# Patient Record
Sex: Female | Born: 1990 | Race: White | Hispanic: No | Marital: Married | Smoking: Former smoker
Health system: Southern US, Community
[De-identification: ages and names within clinical notes are randomized; demographics above are authoritative.]

## PROBLEM LIST (undated history)

## (undated) DIAGNOSIS — F419 Anxiety disorder, unspecified: Secondary | ICD-10-CM

## (undated) DIAGNOSIS — T7840XA Allergy, unspecified, initial encounter: Secondary | ICD-10-CM

## (undated) DIAGNOSIS — I10 Essential (primary) hypertension: Secondary | ICD-10-CM

## (undated) DIAGNOSIS — Z5189 Encounter for other specified aftercare: Secondary | ICD-10-CM

## (undated) HISTORY — DX: Encounter for other specified aftercare: Z51.89

## (undated) HISTORY — DX: Allergy, unspecified, initial encounter: T78.40XA

## (undated) HISTORY — DX: Essential (primary) hypertension: I10

## (undated) HISTORY — DX: Anxiety disorder, unspecified: F41.9

---
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
## 2019-02-18 ENCOUNTER — Encounter: Payer: Self-pay | Admitting: *Deleted

## 2019-02-18 ENCOUNTER — Emergency Department
Admission: EM | Admit: 2019-02-18 | Discharge: 2019-02-18 | Disposition: A | Discharge status: Home / Self Care | Payer: Self-pay | Attending: Emergency Medicine | Admitting: Emergency Medicine

## 2019-02-18 ENCOUNTER — Emergency Department: Payer: Self-pay

## 2019-02-18 ENCOUNTER — Other Ambulatory Visit: Payer: Self-pay

## 2019-02-18 DIAGNOSIS — G43819 Other migraine, intractable, without status migrainosus: Secondary | ICD-10-CM | POA: Insufficient documentation

## 2019-02-18 MED ORDER — ACETAMINOPHEN 500 MG PO TABS
1000.0000 mg | ORAL_TABLET | Freq: Once | ORAL | Status: AC
  Administered 2019-02-18: 1000 mg via ORAL
  Filled 2019-02-18: qty 2

## 2019-02-18 NOTE — ED Triage Notes (Signed)
First RN Note: Pt states was driving in the car earlier today when her whole body went numb. Pt presents A&O x 4. NAD noted at this time.

## 2019-02-18 NOTE — ED Triage Notes (Addendum)
Pt ambulatory to triage.  Pt reports a headache and vomiting x 1.  Pt reports numbness from legs radiating into upper body and vomited again.   Pt has nausea.  No numbness now.  Pt alert  Speech clear.

## 2019-02-18 NOTE — ED Provider Notes (Signed)
[facility] Emergency Department Provider Note  ____________________________________________   First MD Initiated Contact with Patient 02/18/19 2101     (approximate)  I have reviewed the triage vital signs and the nursing notes.   HISTORY  Chief Complaint Headache    HPI Leslie Zavala is a 28 y.o. female who presents with headaches and vomiting x1.  Patient says she has a history of migraines but this 1 was worse than normal.  It was the first time associated with tingling sensation.  She says the headache started this morning was gradual getting worse and then not at its maximum out when she then developed tingling sensation in her bilateral arms, legs and some weakness in her bilateral arms.  She says last about 15 minutes gradually went away on its own, and brought on by her severe headache.  She denies any blurry vision.  Currently her headache is only a 3 out of 10 and she is feeling much better.  She declined additional pain medication.         PMH: migraines   There are no active problems to display for this patient.     Allergies Patient has no known allergies.  No family history on file.  Social History Social History   Tobacco Use  . Smoking status: Never Smoker  . Smokeless tobacco: Never Used  Substance Use Topics  . Alcohol use: Not Currently  . Drug use: Not Currently      Review of Systems Constitutional: No fever/chills Eyes: No visual changes. ENT: No sore throat. Cardiovascular: Denies chest pain. Respiratory: Denies shortness of breath. Gastrointestinal: No abdominal pain.  No nausea, no vomiting.  No diarrhea.  No constipation. Genitourinary: Negative for dysuria. Musculoskeletal: Negative for back pain. Skin: Negative for rash. Neurological: +headache and tingling. All other ROS negative ____________________________________________   PHYSICAL EXAM:  VITAL SIGNS: ED Triage Vitals  Enc Vitals Group      BP 02/18/19 1717 (!) 149/95     Pulse Rate 02/18/19 1717 63     Resp 02/18/19 1717 18     Temp 02/18/19 1717 98.4 F (36.9 C)     Temp Source 02/18/19 1717 Oral     SpO2 02/18/19 1717 100 %     Weight 02/18/19 1719 183 lb (83 kg)     Height 02/18/19 1719 5\' 5"  (1.651 m)     Head Circumference --      Peak Flow --      Pain Score 02/18/19 1718 6     Pain Loc --      Pain Edu? --      Excl. in [place]? --     Constitutional: Alert and oriented. Well appearing and in no acute distress. Eyes: Conjunctivae are normal. EOMI. Head: Atraumatic. Nose: No congestion/rhinnorhea. Mouth/Throat: Mucous membranes are moist.   Neck: No stridor. Trachea Midline. FROM Cardiovascular: Normal rate, regular rhythm. Grossly normal heart sounds.  Good peripheral circulation. Respiratory: Normal respiratory effort.  No retractions. Lungs CTAB. Gastrointestinal: Soft and nontender. No distention. No abdominal bruits.  Musculoskeletal: No lower extremity tenderness nor edema.  No joint effusions. CN 2-12 are intact. Equal sensation. No drift.  Neurologic:  Normal speech and language. No gross focal neurologic deficits are appreciated.  Skin:  Skin is warm, dry and intact. No rash noted. Psychiatric: Mood and affect are normal. Speech and behavior are normal. GU: Deferred   ____________________________________________   LABS (all labs ordered are listed, but only abnormal results are  displayed)  Labs Reviewed - No data to display ____________________________________________   RADIOLOGY   Official radiology report(s): Ct Head Wo Contrast  Result Date: 02/18/2019 CLINICAL DATA:  Headache, worst headache of life EXAM: CT HEAD WITHOUT CONTRAST TECHNIQUE: Contiguous axial images were obtained from the base of the skull through the vertex without intravenous contrast. COMPARISON:  None FINDINGS: Brain: No evidence of acute infarction, hemorrhage, hydrocephalus, extra-axial collection or mass  lesion/mass effect. Vascular: No hyperdense vessel or unexpected calcification. Skull: No calvarial fracture or suspicious osseous lesion. No scalp swelling or hematoma. Sinuses/Orbits: Paranasal sinuses and mastoid air cells are predominantly clear. Included orbital structures are unremarkable. Other: None IMPRESSION: No evidence of acute intracranial hemorrhage or other acute intracranial abnormality. Electronically Signed   By: Kreg Shropshire M.D.   On: 02/18/2019 17:57    ____________________________________________   PROCEDURES  Procedure(s) performed (including Critical Care):  Procedures   ____________________________________________   INITIAL IMPRESSION / ASSESSMENT AND PLAN / ED COURSE  [name] was evaluated in Emergency Department on 02/18/2019 for the symptoms described in the history of present illness. She was evaluated in the context of the global COVID-19 pandemic, which necessitated consideration that the patient might be at risk for infection with the SARS-CoV-2 virus that causes COVID-19. Institutional protocols and algorithms that pertain to the evaluation of patients at risk for COVID-19 are in a state of rapid change based on information released by regulatory bodies including the CDC and federal and state organizations. These policies and algorithms were followed during the patient's care in the ED.    Pt wilh headache with full body numbness.  Most like complex migraine given now that headache has resolved symptoms are gone.  Did not localizes to part of her body to suggest stroke. Consider MS but no prior flare ups but we discussed f/u with PCP if develops other neuro symptoms. NO infectious symptoms to suggest meningitis.  Pt feels better and denies further rx. Feels comfortable with d/c home.   I discussed the provisional nature of ED diagnosis, the treatment so far, the ongoing plan of care, follow up appointments and return precautions with the patient and  any family or support people present. They expressed understanding and agreed with the plan, discharged home.    ____________________________________________   FINAL CLINICAL IMPRESSION(S) / ED DIAGNOSES      Final diagnoses:  Other migraine without status migrainosus, intractable      MEDICATIONS GIVEN DURING THIS VISIT:  Medications  acetaminophen (TYLENOL) tablet 1,000 mg (1,000 mg Oral Given 02/18/19 2056)     ED Discharge Orders    None       Note:  This document was prepared using Dragon voice recognition software and may include unintentional dictation errors.   Concha Se, MD 02/19/19 1016

## 2019-02-18 NOTE — Discharge Instructions (Signed)
You can take Tylenol 1000 mg every 8 hours and ibuprofen 800 every 8 hours to help with the pain.  Return to the ER for worsening pain or any other concerns.  Otherwise you can follow-up with your primary care doctor.  This is most likely secondary to a complex migraine.

## 2019-02-19 ENCOUNTER — Ambulatory Visit: Payer: Self-pay

## 2019-02-19 ENCOUNTER — Encounter: Payer: Self-pay | Admitting: Emergency Medicine

## 2019-02-19 ENCOUNTER — Emergency Department
Admission: EM | Admit: 2019-02-19 | Discharge: 2019-02-19 | Disposition: A | Discharge status: Home / Self Care | Payer: Self-pay | Attending: Student | Admitting: Student

## 2019-02-19 ENCOUNTER — Emergency Department: Payer: Self-pay

## 2019-02-19 ENCOUNTER — Other Ambulatory Visit: Payer: Self-pay

## 2019-02-19 DIAGNOSIS — R29898 Other symptoms and signs involving the musculoskeletal system: Secondary | ICD-10-CM | POA: Insufficient documentation

## 2019-02-19 LAB — CBC
HCT: 42.7 % (ref 36.0–46.0)
Hemoglobin: 15 g/dL (ref 12.0–15.0)
MCH: 32 pg (ref 26.0–34.0)
MCHC: 35.1 g/dL (ref 30.0–36.0)
MCV: 91 fL (ref 80.0–100.0)
Platelets: 311 10*3/uL (ref 150–400)
RBC: 4.69 MIL/uL (ref 3.87–5.11)
RDW: 12.4 % (ref 11.5–15.5)
WBC: 6.8 10*3/uL (ref 4.0–10.5)
nRBC: 0 % (ref 0.0–0.2)

## 2019-02-19 LAB — COMPREHENSIVE METABOLIC PANEL
ALT: 19 U/L (ref 0–44)
AST: 44 U/L — ABNORMAL HIGH (ref 15–41)
Albumin: 4.7 g/dL (ref 3.5–5.0)
Alkaline Phosphatase: 58 U/L (ref 38–126)
Anion gap: 11 (ref 5–15)
BUN: 9 mg/dL (ref 6–20)
CO2: 22 mmol/L (ref 22–32)
Calcium: 9.2 mg/dL (ref 8.9–10.3)
Chloride: 105 mmol/L (ref 98–111)
Creatinine, Ser: 0.76 mg/dL (ref 0.44–1.00)
GFR calc Af Amer: >60 mL/min (ref >60)
GFR calc non Af Amer: >60 mL/min (ref >60)
Glucose, Bld: 89 mg/dL (ref 70–99)
Potassium: 4.2 mmol/L (ref 3.5–5.1)
Sodium: 138 mmol/L (ref 135–145)
Total Bilirubin: 1.1 mg/dL (ref 0.3–1.2)
Total Protein: 7.8 g/dL (ref 6.5–8.1)

## 2019-02-19 LAB — URINALYSIS, ROUTINE W REFLEX MICROSCOPIC
Bilirubin Urine: NEGATIVE
Glucose, UA: NEGATIVE mg/dL
Hgb urine dipstick: NEGATIVE
Ketones, ur: NEGATIVE mg/dL
Leukocytes,Ua: NEGATIVE
Nitrite: NEGATIVE
Protein, ur: NEGATIVE mg/dL
Specific Gravity, Urine: 1.019 (ref 1.005–1.030)
pH: 5 (ref 5.0–8.0)

## 2019-02-19 LAB — PREGNANCY, URINE: Preg Test, Ur: NEGATIVE

## 2019-02-19 MED ORDER — IOHEXOL 350 MG/ML SOLN
75.0000 mL | Freq: Once | INTRAVENOUS | Status: AC | PRN
  Administered 2019-02-19: 18:00:00 75 mL via INTRAVENOUS

## 2019-02-19 MED ORDER — ACETAMINOPHEN 500 MG PO TABS
1000.0000 mg | ORAL_TABLET | Freq: Once | ORAL | Status: AC
  Administered 2019-02-19: 21:00:00 1000 mg via ORAL
  Filled 2019-02-19: qty 2

## 2019-02-19 NOTE — ED Notes (Signed)
Called no answer in the waiting room 

## 2019-02-19 NOTE — Telephone Encounter (Signed)
Incoming call from Patient stating her left arm  Feels heavier than normal.  Onset was this morning. States that's  comes and goes . Patient reports mild headache.  Pt.  States that she has SOB with exertion. States Heartbeats races.  Reviewed protocol with Patient and recommended Patient go to Ed to be evaluated.  Patient voiced understanding.             Reason for Disposition . [1] Numbness of the face, arm or leg AND [2] gradual onset (days to weeks) AND [3] present now (Exception: foot or hand "asleep")  Answer Assessment - Initial Assessment Questions 1. SYMPTOM: "What is the main symptom you are concerned about?" (e.g., weakness, numbness)     Left side heaviness 2. LOCATION: "What part of the body is involved? (face, arm or leg)  One side or both sides of the body?" Note: Most strokes are on one side of the body.       3. ONSET: "When did this start?" (minutes, hours, days) Note: Most strokes have a sudden/abrupt onset.     This morning 4. PATTERN: "Does this come and go, or has it been constant since it started?" "Is it present now?"     Comes and goes 5. OTHER NEUROLOGIC SYMPTOMS: "Does your child have any of the following symptoms: headache, vision loss, double vision, changes in speech, being unsteady on his feet?"     small  Headache  6. CAUSE: "What do you think is causing the *No Answer* ?"     7. CHILD'S APPEARANCE: "How sick is your child acting?" " What is he doing right now?" If asleep, ask: "How was he acting before he went to sleep?"    Patient state that she become breathess  And heart beats fast.  Protocols used: NEUROLOGIC DEFICIT-P-AH

## 2019-02-19 NOTE — ED Triage Notes (Signed)
Pt states she was seen here yesterday for a migraine, was dc'd home last night, this am, woke up feeling "heavy" on her left side, arm and leg. Walked to triage without difficulty. Face symmetrical. Speech clear. States LKW was midnight. States the weakness comes and goes on her left side. NAD.

## 2019-02-19 NOTE — ED Provider Notes (Signed)
[facility] Emergency Department Provider Note  ____________________________________________   First MD Initiated Contact with Patient 02/19/19 1846     (approximate)  I have reviewed the triage vital signs and the nursing notes.  History  Chief Complaint No chief complaint on file.    HPI Leslie Zavala is a 28 y.o. female with history of migraines who presents for sensation of left sided heaviness. Patient was seen here yesterday for complex migraine which improved with medications. She states when she got home she was feeling improved. This morning she woke up with a very mild headache and felt like her left arm and leg were "heavy" but denies any true weakness, decreased use or coordination. No sensory changes, no speech difficulties, no facial droop. She says this "heavy sensation" seems to come and go. At present, she is completely asymptomatic and is sewing in her room. She denies any personal or family hx of CVA, hypercoagulable disorders, or MS. She does report a family history of aneurysms.    Past Medical Hx History reviewed. No pertinent past medical history.  Problem List There are no active problems to display for this patient.   Past Surgical Hx History reviewed. No pertinent surgical history.  Medications Prior to Admission medications   Not on File    Allergies Patient has no known allergies.  Family Hx No family history on file.  Social Hx Social History   Tobacco Use  . Smoking status: Never Smoker  . Smokeless tobacco: Never Used  Substance Use Topics  . Alcohol use: Not Currently  . Drug use: Not Currently     Review of Systems  Constitutional: Negative for fever, chills. Eyes: Negative for visual changes. ENT: Negative for sore throat. Cardiovascular: Negative for chest pain. Respiratory: Negative for shortness of breath. Gastrointestinal: Negative for nausea, vomiting.  Genitourinary: Negative for  dysuria. Musculoskeletal: Negative for leg swelling. Skin: Negative for rash. Neurological: + for for headaches.   Physical Exam  Vital Signs: ED Triage Vitals  Enc Vitals Group     BP 02/19/19 1435 (!) 141/102     Pulse Rate 02/19/19 1435 73     Resp 02/19/19 1435 16     Temp 02/19/19 1435 99.1 F (37.3 C)     Temp Source 02/19/19 1435 Oral     SpO2 02/19/19 1435 100 %     Weight 02/19/19 1430 183 lb (83 kg)     Height 02/19/19 1430 5\' 5"  (1.651 m)     Head Circumference --      Peak Flow --      Pain Score 02/19/19 1430 0     Pain Loc --      Pain Edu? --      Excl. in [place]? --     Constitutional: Alert and oriented.  Head: Normocephalic. Atraumatic. Eyes: Conjunctivae clear. Sclera anicteric. Nose: No congestion. No rhinorrhea. Mouth/Throat: Wearing mask.  Neck: No stridor.   Cardiovascular: Normal rate, regular rhythm. Extremities well perfused. Respiratory: Normal respiratory effort.  Gastrointestinal: Soft. Non-tender. Non-distended.  Musculoskeletal: No lower extremity edema. No deformities. Neurologic:  Normal speech and language. No gross focal neurologic deficits are appreciated. Alert and oriented.  Face symmetric.  Tongue midline.  Cranial nerves II through XII intact. UE and LE strength 5/5 and symmetric. UE and LE SILT. Normal finger to nose. Normal heel to shin. NIHSS 0.  Skin: Skin is warm, dry and intact. No rash noted. Psychiatric: Mood and affect are appropriate for situation.  EKG  N/A   Radiology  CTA Head: IMPRESSION:  Normal CT head  Normal CTA head. No aneurysm identified.   Procedures  Procedure(s) performed (including critical care):  Procedures   Initial Impression / Assessment and Plan / ED Course  28 y.o. female who presents to the ED for minimal headache and intermittent episodes of left sided heaviness, without true weakness or any other neurological symptoms.   Suspect likely complex migraine. Episodes are intermittent  and she is completely asymptomatic currently, with normal neurological exam. Given this, her age, and lack of risk factors do not suspect CVA or TIA. Could consider MS, though no family history. Did offer MRI today for evaluation of this but patient would like to defer for now and follow up with Neurology as outpatient, which is reasonable. Her labs are w/o actionable derangements and CTA head is negative. Do not feel this represents SAH as her headache today is very mild, if any, per her report, not thunderclap and not worst of life.  As such, given above, will plan for discharge with outpatient follow up with Neurology. Patient agreeable. Given return precautions.    Final Clinical Impression(s) / ED Diagnosis  Final diagnoses:  Sensation of heaviness in extremities       Note:  This document was prepared using Dragon voice recognition software and may include unintentional dictation errors.   Miguel Aschoff., MD 02/20/19 1002

## 2019-02-19 NOTE — Discharge Instructions (Signed)
Thank you for letting us take care of you in the emergency department today.   Please continue to take any regular, prescribed medications.   Please follow up with: - Neurology - Your primary care doctor to review your ER visit and follow up on your symptoms.   Please return to the ER for any new or worsening symptoms.

## 2019-02-19 NOTE — ED Notes (Signed)
Called lab about urine preg. They will work on resulting it now.

## 2019-02-19 NOTE — ED Notes (Signed)
EDP Monks at bedside.  

## 2019-04-02 HISTORY — PX: APPENDECTOMY: SHX54

## 2020-10-27 IMAGING — CT CT HEAD W/O CM
3 series · 16 of 47 positions shown, 19 images · non-contrast
Comparison: None

CLINICAL DATA: Headache, worst headache of life

EXAM:
CT HEAD WITHOUT CONTRAST
TECHNIQUE: Contiguous axial images were obtained from the base of the skull
through the vertex without intravenous contrast.

[Series 2: head wo · axial · 0.47mm/px · z∈[-165,-35]mm · 10 of 32 slices shown, 13 images]
[im 3/32  brain]
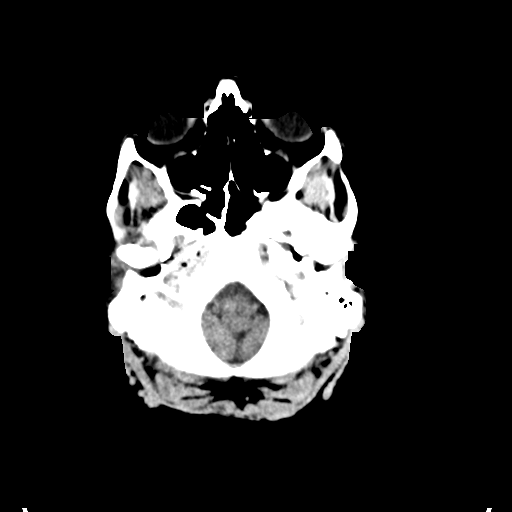
[im 3/32  bone]
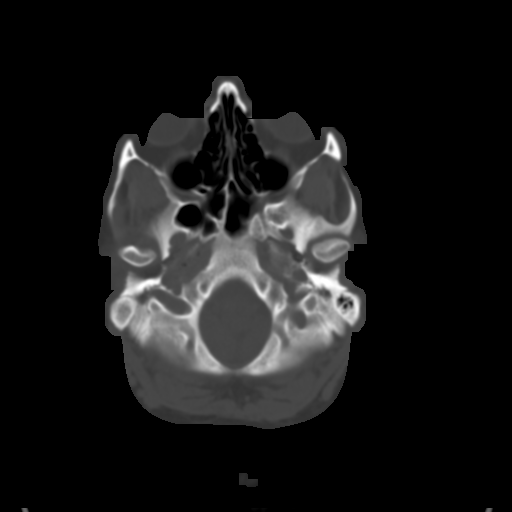
[im 6/32  brain]
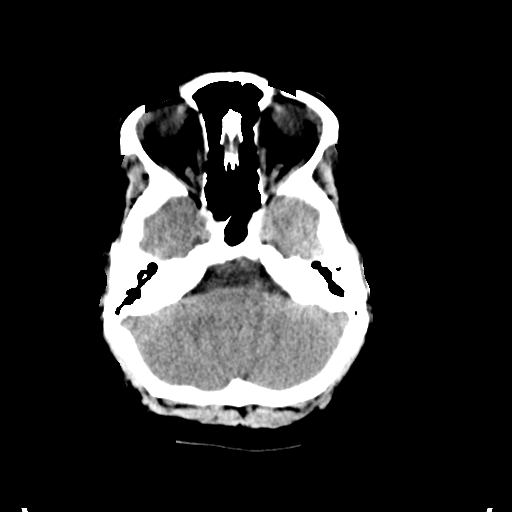
[im 9/32  brain]
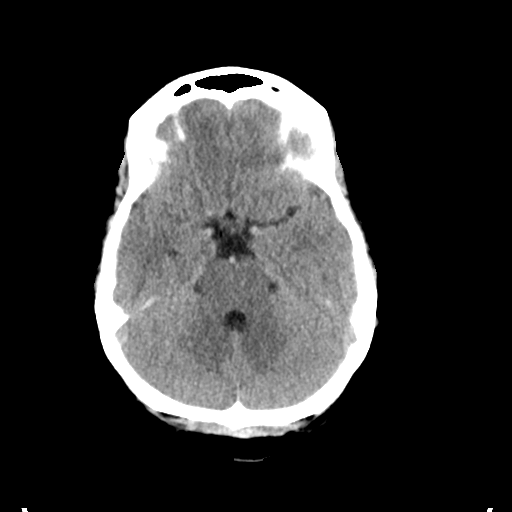
[im 11/32  brain]
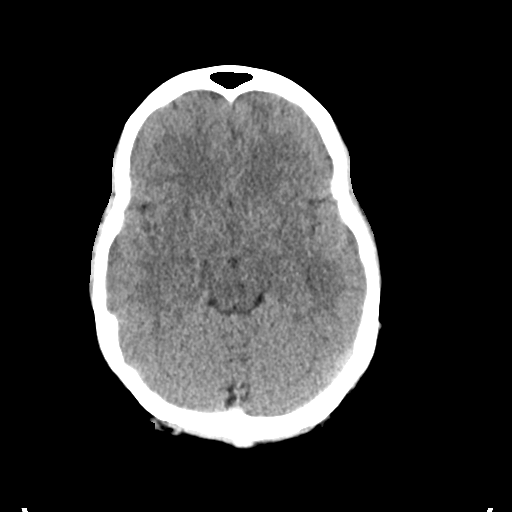
[im 14/32  brain]
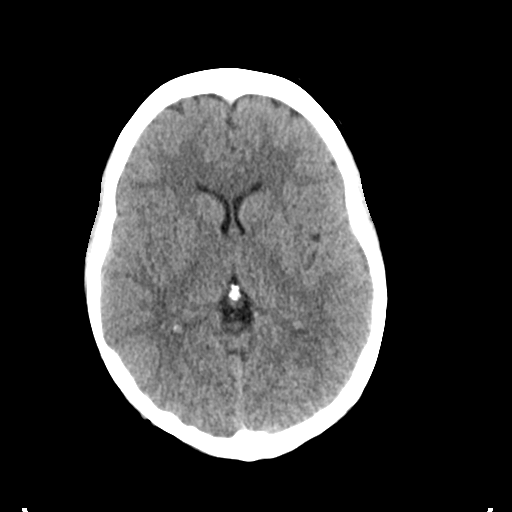
[im 14/32  bone]
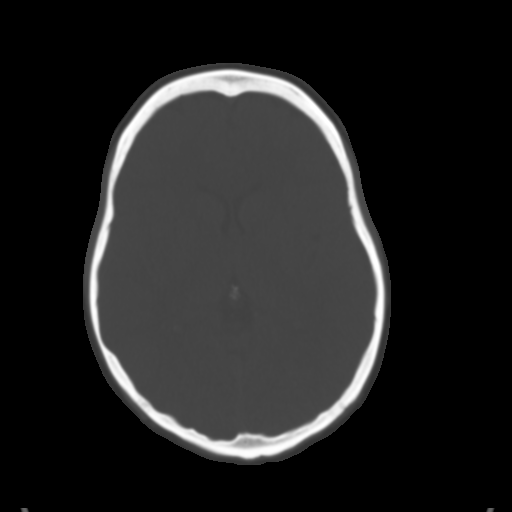
[im 18/32  brain]
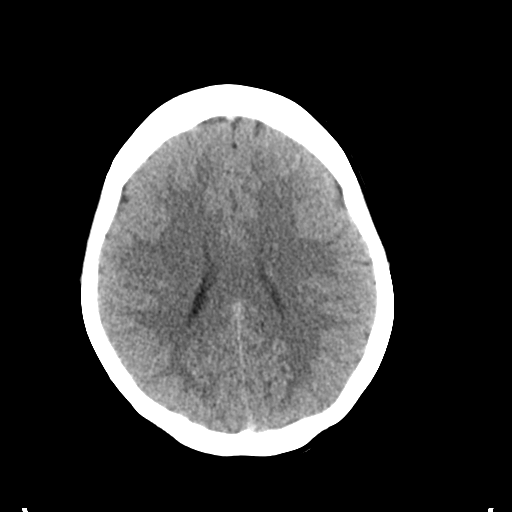
[im 21/32  brain]
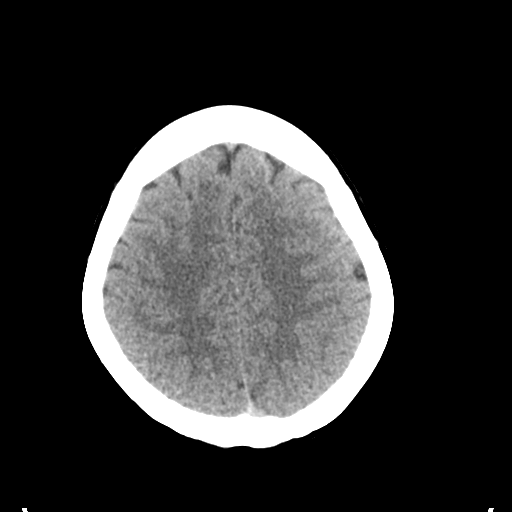
[im 24/32  brain]
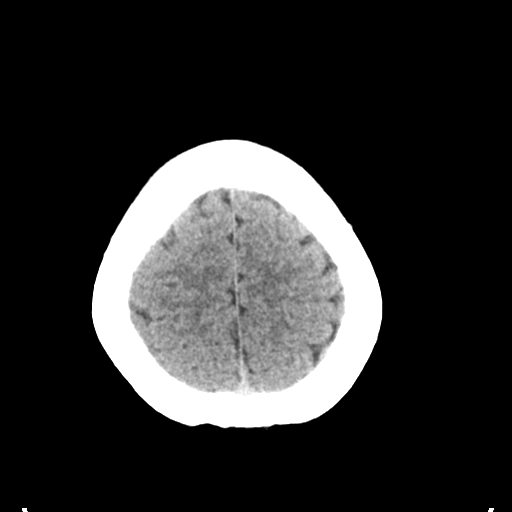
[im 26/32  brain]
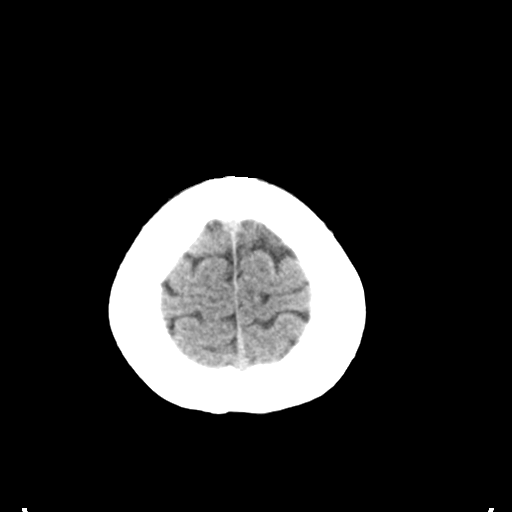
[im 26/32  bone]
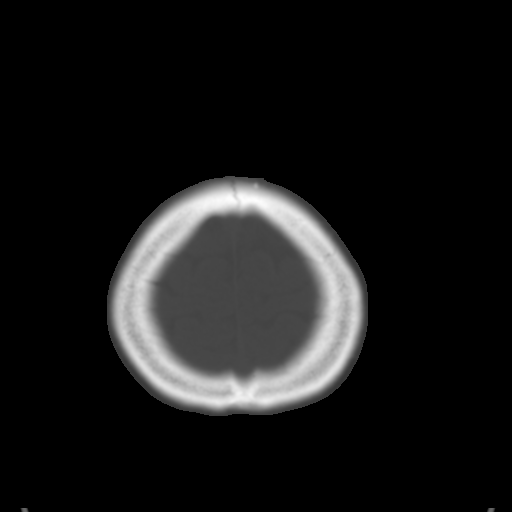
[im 29/32  brain]
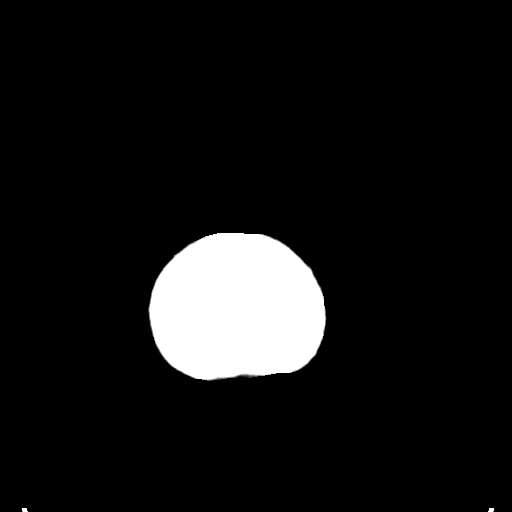

[Series 4: coronal soft tissue · coronal · 0.31mm/px · 3 of 65 slices shown]
[im 22/65  brain]
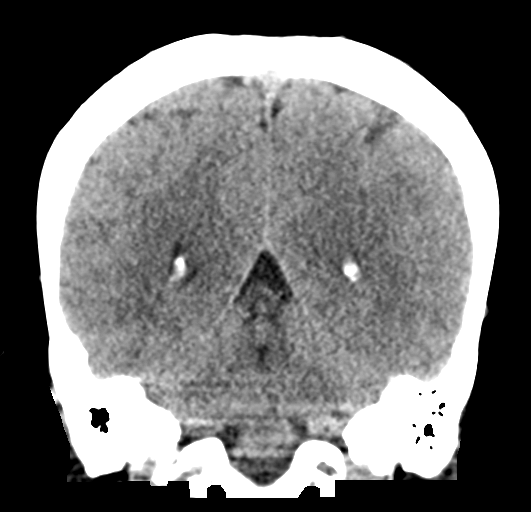
[im 29/65  brain]
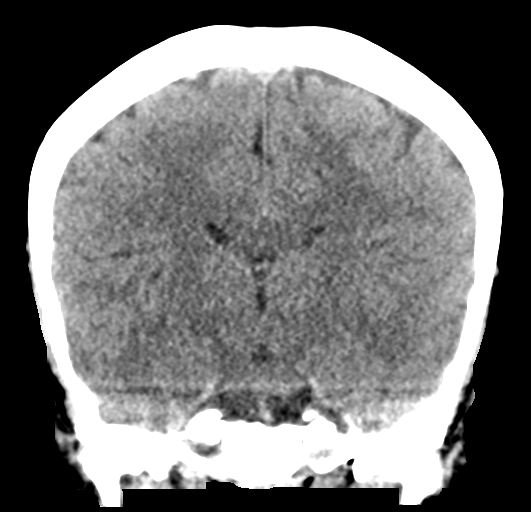
[im 36/65  brain]
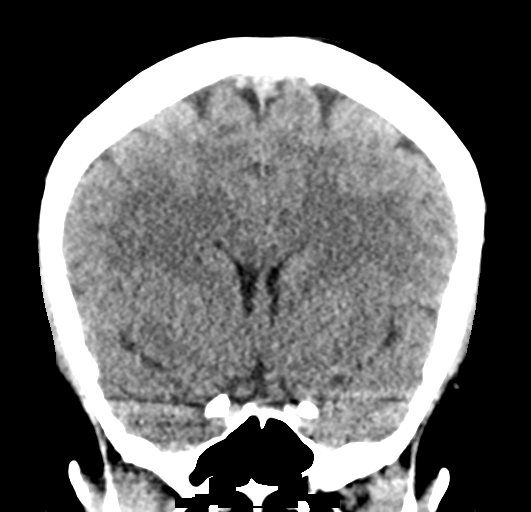

[Series 5: sagittal soft tissue · sagittal · 0.31mm/px · 3 of 55 slices shown]
[im 19/55  brain]
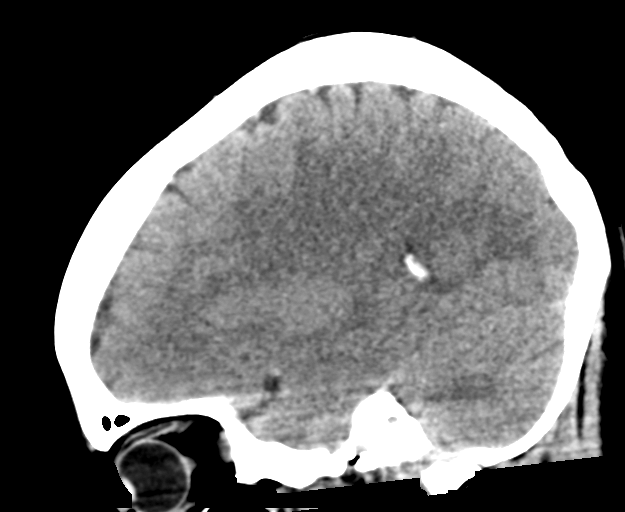
[im 28/55  brain]
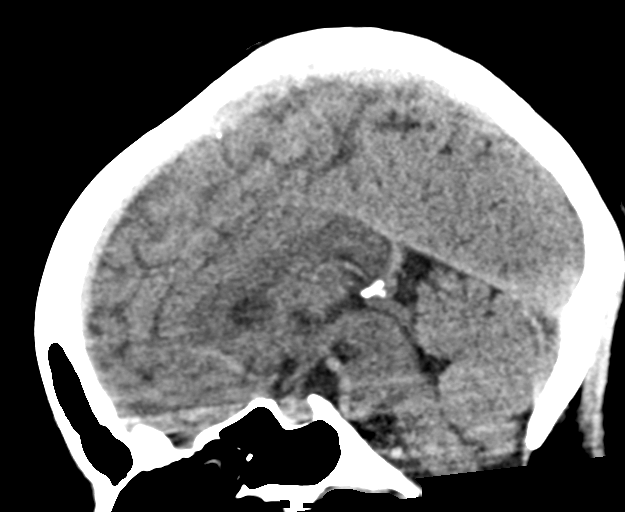
[im 37/55  brain]
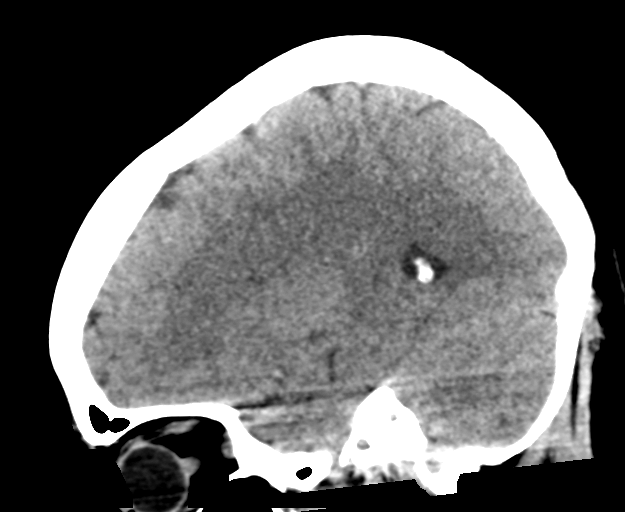

[16 of 47 positions shown; findings below may reference images not displayed]

FINDINGS: Brain: No evidence of acute infarction, hemorrhage, hydrocephalus,
extra-axial collection or mass lesion/mass effect.

Vascular: No hyperdense vessel or unexpected calcification.

Skull: No calvarial fracture or suspicious osseous lesion. No scalp
swelling or hematoma.

Sinuses/Orbits: Paranasal sinuses and mastoid air cells are
predominantly clear. Included orbital structures are unremarkable.

Other: None
IMPRESSION: No evidence of acute intracranial hemorrhage or other acute
intracranial abnormality.

## 2021-08-03 ENCOUNTER — Ambulatory Visit: Payer: Self-pay

## 2021-08-03 NOTE — Telephone Encounter (Signed)
?  Chief Complaint: HTN ?Symptoms: BP 168/116, 145/109, initial 142/117, headache ?Frequency: today during dental procedure ?Pertinent Negatives: Patient denies blurred vision or chest pain ?Disposition: [x] ED /[] Urgent Care (no appt availability in office) / [] Appointment(In office/virtual)/ []  [facility]/ [] Home Care/ [] Refused Recommended Disposition /[] [place] [facility]/ []  Follow-up with PCP ?Additional Notes: pt had root canal done today and BP was elevated initially before procedure was performed. Pt states they let her rest and relax after and BP was still elevated so dentist advised to get checked out. Pt has NPA scheduled but advised her with current symptoms and BP being elevated to go to ED to be evaluated. Pt verbalized understanding. , agent was going to call pt back to provider earlier NPA with different practice. ? ?Reason for Disposition ? Systolic BP  >= 180 OR Diastolic >= 110 ? ?Answer Assessment - Initial Assessment Questions ?1. BLOOD PRESSURE: "What is the blood pressure?" "Did you take at least two measurements 5 minutes apart?" ?    BP 168/116, 145/109 ?2. ONSET: "When did you take your blood pressure?" ?    Today about 20 mins ago  ?3. HOW: "How did you obtain the blood pressure?" (e.g., visiting nurse, automatic home BP monitor) ?    Automatic  ?4. HISTORY: "Do you have a history of high blood pressure?" ?    no ?5. MEDICATIONS: "Are you taking any medications for blood pressure?" "Have you missed any doses recently?" ?    no ?6. OTHER SYMPTOMS: "Do you have any symptoms?" (e.g., headache, chest pain, blurred vision, difficulty breathing, weakness) ?    HA ? ?Protocols used: Blood Pressure - High-A-AH ? ?

## 2021-10-01 ENCOUNTER — Ambulatory Visit: Payer: Self-pay | Admitting: Internal Medicine

## 2022-01-28 ENCOUNTER — Ambulatory Visit: Payer: Self-pay | Admitting: Internal Medicine

## 2022-04-16 ENCOUNTER — Ambulatory Visit: Payer: Self-pay | Admitting: Internal Medicine

## 2022-10-31 ENCOUNTER — Ambulatory Visit: Payer: Self-pay | Admitting: Internal Medicine

## 2022-11-20 ENCOUNTER — Ambulatory Visit (INDEPENDENT_AMBULATORY_CARE_PROVIDER_SITE_OTHER): Payer: Self-pay | Admitting: Internal Medicine

## 2022-11-20 ENCOUNTER — Encounter: Payer: Self-pay | Admitting: Internal Medicine

## 2022-11-20 VITALS — BP 140/96 | HR 78 | Temp 96.6°F | Ht 64.5 in | Wt 212.0 lb

## 2022-11-20 DIAGNOSIS — Z833 Family history of diabetes mellitus: Secondary | ICD-10-CM

## 2022-11-20 DIAGNOSIS — I10 Essential (primary) hypertension: Secondary | ICD-10-CM

## 2022-11-20 DIAGNOSIS — E66812 Obesity, class 2: Secondary | ICD-10-CM | POA: Insufficient documentation

## 2022-11-20 DIAGNOSIS — Z0001 Encounter for general adult medical examination with abnormal findings: Secondary | ICD-10-CM

## 2022-11-20 DIAGNOSIS — Z6835 Body mass index (BMI) 35.0-35.9, adult: Secondary | ICD-10-CM

## 2022-11-20 DIAGNOSIS — R21 Rash and other nonspecific skin eruption: Secondary | ICD-10-CM

## 2022-11-20 MED ORDER — OLMESARTAN MEDOXOMIL 20 MG PO TABS: 20.0000 mg | ORAL_TABLET | Freq: Every day | ORAL | 0 refills | Status: DC

## 2022-11-20 NOTE — Assessment & Plan Note (Signed)
Encourage diet and exercise for weight loss 

## 2022-11-20 NOTE — Progress Notes (Signed)
HPI  Patient presents to clinic today to establish care and for management of the conditions listed below.  HTN: Her BP today is 140/96.  She has had multiple elevated blood pressures in the past.  She is not currently taking antihypertensive medications.  There is no ECG on file.  Flu: never Tetanus: unsure Covid: never  Pap smear: 2020, [facility] Dentist: Biannually  Diet: She does eat meat. She consumes fruits and veggies. She does eat some fried foods. She drinks mostly dt soda, water Exercise: walking  History reviewed. No pertinent past medical history.  No current outpatient medications on file.   No current facility-administered medications for this visit.    Allergies  Allergen Reactions   Codeine Nausea Only and Nausea And Vomiting    Family History  Problem Relation Age of Onset   Diabetes Mother    Depression Mother    Kidney cancer Father    Heart failure Father    Depression Father    Drug abuse Father    Sleep apnea Father    Diabetes Father    Hypertension Sister    Hypertension Sister    Mental illness Brother    Anorexia nervosa Brother    Anxiety disorder Brother    Depression Brother    Alcohol abuse Brother    Breast cancer Paternal Aunt     Social History   Socioeconomic History   Marital status: Single    Spouse name: Not on file   Number of children: Not on file   Years of education: Not on file   Highest education level: Not on file  Occupational History   Not on file  Tobacco Use   Smoking status: Never   Smokeless tobacco: Never  Vaping Use   Vaping status: Never Used  Substance and Sexual Activity   Alcohol use: Not Currently   Drug use: Not Currently   Sexual activity: Not on file  Other Topics Concern   Not on file  Social History Narrative   Not on file   Social Determinants of Health   Financial Resource Strain: Not on file  Food Insecurity: Not on file  Transportation Needs: Not on file  Physical  Activity: Not on file  Stress: Not on file  Social Connections: Not on file  Intimate Partner Violence: Not on file    ROS:  Constitutional: Denies fever, malaise, fatigue, headache or abrupt weight changes.  HEENT: Denies eye pain, eye redness, ear pain, ringing in the ears, wax buildup, runny nose, nasal congestion, bloody nose, or sore throat. Respiratory: Denies difficulty breathing, shortness of breath, cough or sputum production.   Cardiovascular: Denies chest pain, chest tightness, palpitations or swelling in the hands or feet.  Gastrointestinal: Denies abdominal pain, bloating, constipation, diarrhea or blood in the stool.  GU: Denies frequency, urgency, pain with urination, blood in urine, odor or discharge. Musculoskeletal: Denies decrease in range of motion, difficulty with gait, muscle pain or joint pain and swelling.  Skin: Pt reports recurrent rashes. Denies redness, lesions or ulcercations.  Neurological: Denies dizziness, difficulty with memory, difficulty with speech or problems with balance and coordination.  Psych: Denies anxiety, depression, SI/HI.  No other specific complaints in a complete review of systems (except as listed in HPI above).  PE:  BP (!) 140/96 (BP Location: Left Arm, Patient Position: Sitting, Cuff Size: Large)   Pulse 78   Temp (!) 96.6 F (35.9 C) (Temporal)   Ht 5' 4.5" (1.638 m)  Wt 212 lb (96.2 kg)   SpO2 96%   BMI 35.83 kg/m  Wt Readings from Last 3 Encounters:  11/20/22 212 lb (96.2 kg)  02/19/19 180 lb (81.6 kg)  02/18/19 183 lb (83 kg)    General: Appears her stated age, obese, in NAD. HEENT: Head: normal shape and size; Eyes: sclera white, no icterus, conjunctiva pink, PERRLA and EOMs intact;  Neck: Neck supple, trachea midline. No masses, lumps or thyromegaly present.  Cardiovascular: Normal rate and rhythm. S1,S2 noted.  No murmur, rubs or gallops noted. No JVD or BLE edema.  Pulmonary/Chest: Normal effort and positive  vesicular breath sounds. No respiratory distress. No wheezes, rales or ronchi noted.  Abdomen: Normal bowel sounds,  Musculoskeletal: Strength 5/5 BUE/BLE. No difficulty with gait.  Neurological: Alert and oriented. Cranial nerves II-XII grossly intact. Coordination normal.  Psychiatric: Mood and affect normal. Behavior is normal. Judgment and thought content normal.     BMET    Component Value Date/Time   NA 138 02/19/2019 1451   K 4.2 02/19/2019 1451   CL 105 02/19/2019 1451   CO2 22 02/19/2019 1451   GLUCOSE 89 02/19/2019 1451   BUN 9 02/19/2019 1451   CREATININE 0.76 02/19/2019 1451   CALCIUM 9.2 02/19/2019 1451   GFRNONAA >60 02/19/2019 1451   GFRAA >60 02/19/2019 1451    Lipid Panel  No results found for: "CHOL", "TRIG", "HDL", "CHOLHDL", "VLDL", "LDLCALC"  CBC    Component Value Date/Time   WBC 6.8 02/19/2019 1451   RBC 4.69 02/19/2019 1451   HGB 15.0 02/19/2019 1451   HCT 42.7 02/19/2019 1451   PLT 311 02/19/2019 1451   MCV 91.0 02/19/2019 1451   MCH 32.0 02/19/2019 1451   MCHC 35.1 02/19/2019 1451   RDW 12.4 02/19/2019 1451    Hgb A1C No results found for: "HGBA1C"   Assessment and Plan:  Preventative health maintenance:  Encouraged her to get a flu shot in the fall Tetanus should be up-to-date if she gave birth in 2020 Encouraged her to get her COVID-vaccine Pap smear UTD per her report Encouraged her to consume a balanced diet and exercise regimen Advised her to see an eye doctor and dentist annually We will check CBC, c-Met, TSH, lipid, A1c today  Recurrent rashes:  Will check celiac panel per request although advised her that this may not be covered  RTC in 2 weeks for nurse visit BP check.  1 year for your annual exam Nicki Reaper, NP

## 2022-11-20 NOTE — Assessment & Plan Note (Signed)
Will trial olmesartan 20 mg daily Reinforced DASH diet and exercise for weight loss C-Met today

## 2022-11-20 NOTE — Patient Instructions (Signed)

## 2022-11-21 LAB — HEMOGLOBIN A1C
Hgb A1c MFr Bld: 5.2 %{Hb} (ref <5.7)
Mean Plasma Glucose: 103 mg/dL
eAG (mmol/L): 5.7 mmol/L

## 2022-11-21 LAB — COMPLETE METABOLIC PANEL WITH GFR
AG Ratio: 1.9 (calc) (ref 1.0–2.5)
ALT: 17 U/L (ref 6–29)
AST: 17 U/L (ref 10–30)
Albumin: 4.9 g/dL (ref 3.6–5.1)
Alkaline phosphatase (APISO): 54 U/L (ref 31–125)
BUN: 11 mg/dL (ref 7–25)
CO2: 23 mmol/L (ref 20–32)
Calcium: 10.1 mg/dL (ref 8.6–10.2)
Chloride: 105 mmol/L (ref 98–110)
Creat: 0.76 mg/dL (ref 0.50–0.97)
Globulin: 2.6 g/dL (ref 1.9–3.7)
Glucose, Bld: 87 mg/dL (ref 65–99)
Potassium: 4.3 mmol/L (ref 3.5–5.3)
Sodium: 137 mmol/L (ref 135–146)
Total Bilirubin: 0.3 mg/dL (ref 0.2–1.2)
Total Protein: 7.5 g/dL (ref 6.1–8.1)
eGFR: 107 mL/min/{1.73_m2} (ref >=60)

## 2022-11-21 LAB — TSH: TSH: 1.47 m[IU]/L

## 2022-11-21 LAB — CBC
HCT: 42.8 % (ref 35.0–45.0)
Hemoglobin: 14.6 g/dL (ref 11.7–15.5)
MCH: 31.5 pg (ref 27.0–33.0)
MCHC: 34.1 g/dL (ref 32.0–36.0)
MCV: 92.4 fL (ref 80.0–100.0)
MPV: 10.1 fL (ref 7.5–12.5)
Platelets: 331 10*3/uL (ref 140–400)
RBC: 4.63 10*6/uL (ref 3.80–5.10)
RDW: 12.4 % (ref 11.0–15.0)
WBC: 7.7 10*3/uL (ref 3.8–10.8)

## 2022-11-21 LAB — LIPID PANEL
Cholesterol: 215 mg/dL — ABNORMAL HIGH (ref <200)
HDL: 54 mg/dL (ref >=50)
LDL Cholesterol (Calc): 130 mg/dL — ABNORMAL HIGH
Non-HDL Cholesterol (Calc): 161 mg/dL — ABNORMAL HIGH (ref <130)
Total CHOL/HDL Ratio: 4 (calc) (ref <5.0)
Triglycerides: 171 mg/dL — ABNORMAL HIGH (ref <150)

## 2022-12-04 ENCOUNTER — Ambulatory Visit: Payer: Self-pay

## 2022-12-25 ENCOUNTER — Telehealth: Payer: Self-pay

## 2022-12-25 NOTE — Telephone Encounter (Signed)
LMTCB 12/25/2022.  Pt may schedule a nurse only visit to recheck her BP.     Thanks,   -Vernona Rieger

## 2022-12-25 NOTE — Telephone Encounter (Signed)
Copied from CRM [id number]. Topic: Appointment Scheduling - Scheduling Inquiry for Clinic >> Dec 20, 2022  1:54 PM Franchot Heidelberg wrote: Reason for CRM: Pt called reporting that [facility] instructed her to schedule a nurse visit for a blood pressure check to save money since she does not have insurance.   Best contact: [phone]  Please advise >> Dec 24, 2022 10:39 AM Rachell B wrote: Leslie Zavala  was  going to  follow up with  regina

## 2023-01-06 ENCOUNTER — Other Ambulatory Visit: Payer: Self-pay | Admitting: Internal Medicine

## 2023-01-06 NOTE — Telephone Encounter (Signed)
Medication Refill - Medication: olmesartan (BENICAR) 20 MG tablet [[id number]]   Has the patient contacted their pharmacy? Yes.     (Agent: If yes, when and what did the pharmacy advise?) Contact PCP, no refills   Preferred Pharmacy (with phone number or street name): [facility] in [place] on [address]   Has the patient been seen for an appointment in the last year OR does the patient have an upcoming appointment? Yes.    Agent: Please be advised that RX refills may take up to 3 business days. We ask that you follow-up with your pharmacy.

## 2023-01-07 MED ORDER — OLMESARTAN MEDOXOMIL 20 MG PO TABS: 20.0000 mg | ORAL_TABLET | Freq: Every day | ORAL | 0 refills | Status: DC

## 2023-01-07 NOTE — Telephone Encounter (Signed)
Requested Prescriptions  Pending Prescriptions Disp Refills   olmesartan (BENICAR) 20 MG tablet 90 tablet 0    Sig: Take 1 tablet (20 mg total) by mouth daily.     Cardiovascular:  Angiotensin Receptor Blockers Failed - 01/06/2023 11:25 AM      Failed - Last BP in normal range    BP Readings from Last 1 Encounters:  11/20/22 (!) 140/96         Passed - Cr in normal range and within 180 days    Creat  Date Value Ref Range Status  11/20/2022 0.76 0.50 - 0.97 mg/dL Final         Passed - K in normal range and within 180 days    Potassium  Date Value Ref Range Status  11/20/2022 4.3 3.5 - 5.3 mmol/L Final         Passed - Patient is not pregnant      Passed - Valid encounter within last 6 months    Recent Outpatient Visits           1 month ago Encounter for general adult medical examination with abnormal findings   [place] [facility] [place], Salvadore Oxford, NP

## 2023-05-12 ENCOUNTER — Other Ambulatory Visit: Payer: Self-pay | Admitting: Internal Medicine

## 2023-05-12 NOTE — Telephone Encounter (Signed)
 Medication Refill -  Most Recent Primary Care Visit:  Provider: Carollynn Cirri  Department: [id number]  Visit Type: NEW PATIENT  Date: 11/20/2022  Medication: olmesartan  (BENICAR ) 20 MG tablet [[id number]]   Has the patient contacted their pharmacy? Yes (Agent: If no, request that the patient contact the pharmacy for the refill. If patient does not wish to contact the pharmacy document the reason why and proceed with request.) (Agent: If yes, when and what did the pharmacy advise?)  Is this the correct pharmacy for this prescription? Yes If no, delete pharmacy and type the correct one.  This is the patient's preferred pharmacy:  [id number] - [place],  - [address] AT [facility] OF [address] [place] [address] [place] [address] Phone: [phone] Fax: [phone]   Has the prescription been filled recently? Yes  Is the patient out of the medication? Yes Pt reporting that she has been out of medication since Friday  Has the patient been seen for an appointment in the last year OR does the patient have an upcoming appointment? Yes  Can we respond through MyChart? Yes  Agent: Please be advised that Rx refills may take up to 3 business days. We ask that you follow-up with your pharmacy.

## 2023-05-13 MED ORDER — OLMESARTAN MEDOXOMIL 20 MG PO TABS: 20.0000 mg | ORAL_TABLET | Freq: Every day | ORAL | 0 refills | Status: DC

## 2023-05-13 NOTE — Telephone Encounter (Signed)
Requested Prescriptions  Pending Prescriptions Disp Refills   olmesartan (BENICAR) 20 MG tablet 90 tablet 0    Sig: Take 1 tablet (20 mg total) by mouth daily.     Cardiovascular:  Angiotensin Receptor Blockers Failed - 05/13/2023  1:47 PM      Failed - Last BP in normal range    BP Readings from Last 1 Encounters:  11/20/22 (!) 140/96         Passed - Cr in normal range and within 180 days    Creat  Date Value Ref Range Status  11/20/2022 0.76 0.50 - 0.97 mg/dL Final         Passed - K in normal range and within 180 days    Potassium  Date Value Ref Range Status  11/20/2022 4.3 3.5 - 5.3 mmol/L Final         Passed - Patient is not pregnant      Passed - Valid encounter within last 6 months    Recent Outpatient Visits           5 months ago Encounter for general adult medical examination with abnormal findings   [place] [facility] [place], Salvadore Oxford, NP       Future Appointments             In 2 weeks Sampson Si, Salvadore Oxford, NP [facility]

## 2023-06-02 ENCOUNTER — Encounter: Payer: Self-pay | Admitting: Internal Medicine

## 2023-06-02 NOTE — Progress Notes (Deleted)
 HPI  Patient presents to clinic today to establish care and for management of the conditions listed below.  HTN: Her BP today is 140/96.  She has had multiple elevated blood pressures in the past.  She is taking olmesartan as prescribed.  There is no ECG on file.  Flu: never Tetanus: unsure Covid: never  Pap smear: 2020, [facility] Dentist: Biannually  Diet: She does eat meat. She consumes fruits and veggies. She does eat some fried foods. She drinks mostly dt soda, water Exercise: walking  No past medical history on file.  Current Outpatient Medications  Medication Sig Dispense Refill   olmesartan (BENICAR) 20 MG tablet Take 1 tablet (20 mg total) by mouth daily. 90 tablet 0   No current facility-administered medications for this visit.    Allergies  Allergen Reactions   Codeine Nausea Only and Nausea And Vomiting    Family History  Problem Relation Age of Onset   Diabetes Mother    Depression Mother    Kidney cancer Father    Heart failure Father    Depression Father    Drug abuse Father    Sleep apnea Father    Diabetes Father    Hypertension Sister    Hypertension Sister    Mental illness Brother    Anorexia nervosa Brother    Anxiety disorder Brother    Depression Brother    Alcohol abuse Brother    Breast cancer Paternal Aunt     Social History   Socioeconomic History   Marital status: Single    Spouse name: Not on file   Number of children: Not on file   Years of education: Not on file   Highest education level: Not on file  Occupational History   Not on file  Tobacco Use   Smoking status: Never   Smokeless tobacco: Never  Vaping Use   Vaping status: Never Used  Substance and Sexual Activity   Alcohol use: Not Currently   Drug use: Not Currently   Sexual activity: Not on file  Other Topics Concern   Not on file  Social History Narrative   Not on file   Social Drivers of Health   Financial Resource Strain: Not on file  Food  Insecurity: Not on file  Transportation Needs: Not on file  Physical Activity: Not on file  Stress: Not on file  Social Connections: Not on file  Intimate Partner Violence: Not on file    ROS:  Constitutional: Denies fever, malaise, fatigue, headache or abrupt weight changes.  HEENT: Denies eye pain, eye redness, ear pain, ringing in the ears, wax buildup, runny nose, nasal congestion, bloody nose, or sore throat. Respiratory: Denies difficulty breathing, shortness of breath, cough or sputum production.   Cardiovascular: Denies chest pain, chest tightness, palpitations or swelling in the hands or feet.  Gastrointestinal: Denies abdominal pain, bloating, constipation, diarrhea or blood in the stool.  GU: Denies frequency, urgency, pain with urination, blood in urine, odor or discharge. Musculoskeletal: Denies decrease in range of motion, difficulty with gait, muscle pain or joint pain and swelling.  Skin:  Denies redness, rashes, lesions or ulcercations.  Neurological: Denies dizziness, difficulty with memory, difficulty with speech or problems with balance and coordination.  Psych: Denies anxiety, depression, SI/HI.  No other specific complaints in a complete review of systems (except as listed in HPI above).  PE:  There were no vitals taken for this visit. Wt Readings from Last 3 Encounters:  11/20/22 212 lb (  96.2 kg)  02/19/19 180 lb (81.6 kg)  02/18/19 183 lb (83 kg)    General: Appears her stated age, obese, in NAD. HEENT: Head: normal shape and size; Eyes: sclera white, no icterus, conjunctiva pink, PERRLA and EOMs intact;  Neck: Neck supple, trachea midline. No masses, lumps or thyromegaly present.  Cardiovascular: Normal rate and rhythm. S1,S2 noted.  No murmur, rubs or gallops noted. No JVD or BLE edema.  Pulmonary/Chest: Normal effort and positive vesicular breath sounds. No respiratory distress. No wheezes, rales or ronchi noted.  Abdomen: Normal bowel sounds,   Musculoskeletal: Strength 5/5 BUE/BLE. No difficulty with gait.  Neurological: Alert and oriented. Cranial nerves II-XII grossly intact. Coordination normal.  Psychiatric: Mood and affect normal. Behavior is normal. Judgment and thought content normal.     BMET    Component Value Date/Time   NA 137 11/20/2022 1026   K 4.3 11/20/2022 1026   CL 105 11/20/2022 1026   CO2 23 11/20/2022 1026   GLUCOSE 87 11/20/2022 1026   BUN 11 11/20/2022 1026   CREATININE 0.76 11/20/2022 1026   CALCIUM 10.1 11/20/2022 1026   GFRNONAA >60 02/19/2019 1451   GFRAA >60 02/19/2019 1451    Lipid Panel     Component Value Date/Time   CHOL 215 (H) 11/20/2022 1026   TRIG 171 (H) 11/20/2022 1026   HDL 54 11/20/2022 1026   CHOLHDL 4.0 11/20/2022 1026   LDLCALC 130 (H) 11/20/2022 1026    CBC    Component Value Date/Time   WBC 7.7 11/20/2022 1026   RBC 4.63 11/20/2022 1026   HGB 14.6 11/20/2022 1026   HCT 42.8 11/20/2022 1026   PLT 331 11/20/2022 1026   MCV 92.4 11/20/2022 1026   MCH 31.5 11/20/2022 1026   MCHC 34.1 11/20/2022 1026   RDW 12.4 11/20/2022 1026    Hgb A1C Lab Results  Component Value Date   HGBA1C 5.2 11/20/2022     Assessment and Plan:  Preventative health maintenance:  Flu Tetanus  Encouraged her to get her COVID-vaccine Pap smear  Encouraged her to consume a balanced diet and exercise regimen Advised her to see an eye doctor and dentist annually We will check CBC, c-Met, TSH, lipid, A1c today   RTC in 1 year for your annual exam Nicki Reaper, NP

## 2023-06-12 ENCOUNTER — Other Ambulatory Visit (HOSPITAL_COMMUNITY)
Admission: RE | Admit: 2023-06-12 | Discharge: 2023-06-12 | Disposition: A | Discharge status: Home / Self Care | Payer: Self-pay | Source: Ambulatory Visit | Attending: Internal Medicine | Admitting: Internal Medicine

## 2023-06-12 ENCOUNTER — Encounter: Payer: Self-pay | Admitting: Internal Medicine

## 2023-06-12 ENCOUNTER — Ambulatory Visit (INDEPENDENT_AMBULATORY_CARE_PROVIDER_SITE_OTHER): Payer: Self-pay | Admitting: Internal Medicine

## 2023-06-12 VITALS — BP 138/82 | Ht 64.5 in | Wt 210.6 lb

## 2023-06-12 DIAGNOSIS — Z01419 Encounter for gynecological examination (general) (routine) without abnormal findings: Secondary | ICD-10-CM | POA: Insufficient documentation

## 2023-06-12 DIAGNOSIS — E782 Mixed hyperlipidemia: Secondary | ICD-10-CM | POA: Insufficient documentation

## 2023-06-12 DIAGNOSIS — Z124 Encounter for screening for malignant neoplasm of cervix: Secondary | ICD-10-CM | POA: Insufficient documentation

## 2023-06-12 NOTE — Progress Notes (Signed)
 HPI  Patient presents to clinic today for her Pap smear.  Her last Pap smear was in 2020.  No past medical history on file.  Current Outpatient Medications  Medication Sig Dispense Refill   olmesartan (BENICAR) 20 MG tablet Take 1 tablet (20 mg total) by mouth daily. 90 tablet 0   No current facility-administered medications for this visit.    Allergies  Allergen Reactions   Codeine Nausea Only and Nausea And Vomiting    Family History  Problem Relation Age of Onset   Diabetes Mother    Depression Mother    Kidney cancer Father    Heart failure Father    Depression Father    Drug abuse Father    Sleep apnea Father    Diabetes Father    Hypertension Sister    Hypertension Sister    Mental illness Brother    Anorexia nervosa Brother    Anxiety disorder Brother    Depression Brother    Alcohol abuse Brother    Breast cancer Paternal Aunt     Social History   Socioeconomic History   Marital status: Single    Spouse name: Not on file   Number of children: Not on file   Years of education: Not on file   Highest education level: 12th grade  Occupational History   Not on file  Tobacco Use   Smoking status: Never   Smokeless tobacco: Never  Vaping Use   Vaping status: Never Used  Substance and Sexual Activity   Alcohol use: Not Currently   Drug use: Not Currently   Sexual activity: Not on file  Other Topics Concern   Not on file  Social History Narrative   Not on file   Social Drivers of Health   Financial Resource Strain: Low Risk  (06/11/2023)   Overall Financial Resource Strain (CARDIA)    Difficulty of Paying Living Expenses: Not hard at all  Food Insecurity: No Food Insecurity (06/11/2023)   Hunger Vital Sign    Worried About Running Out of Food in the Last Year: Never true    Ran Out of Food in the Last Year: Never true  Transportation Needs: No Transportation Needs (06/11/2023)   PRAPARE - Administrator, Civil Service (Medical): No     Lack of Transportation (Non-Medical): No  Physical Activity: Sufficiently Active (06/11/2023)   Exercise Vital Sign    Days of Exercise per Week: 5 days    Minutes of Exercise per Session: 60 min  Stress: No Stress Concern Present (06/11/2023)   [employer] of Occupational Health - Occupational Stress Questionnaire    Feeling of Stress : Only a little  Social Connections: Socially Integrated (06/11/2023)   Social Connection and Isolation Panel [NHANES]    Frequency of Communication with Friends and Family: More than three times a week    Frequency of Social Gatherings with Friends and Family: Once a week    Attends Religious Services: More than 4 times per year    Active Member of [employer] or Organizations: Yes    Attends Engineer, structural: More than 4 times per year    Marital Status: Married  Catering manager Violence: Not on file    ROS:  Constitutional: Denies fever, malaise, fatigue, headache or abrupt weight changes.  Respiratory: Denies difficulty breathing, shortness of breath, cough or sputum production.   Cardiovascular: Denies chest pain, chest tightness, palpitations or swelling in the hands or feet.  Gastrointestinal: Denies abdominal pain,  bloating, constipation, diarrhea or blood in the stool.  GU: Denies frequency, urgency, pain with urination, blood in urine, odor or discharge.  No other specific complaints in a complete review of systems (except as listed in HPI above).  PE:  BP 138/82 (BP Location: Left Arm, Patient Position: Sitting, Cuff Size: Normal)   Ht 5' 4.5" (1.638 m)   Wt 210 lb 9.6 oz (95.5 kg)   LMP 06/03/2023 (Approximate)   BMI 35.59 kg/m   Wt Readings from Last 3 Encounters:  11/20/22 212 lb (96.2 kg)  02/19/19 180 lb (81.6 kg)  02/18/19 183 lb (83 kg)    General: Appears her stated age, obese, in NAD. Neck: Neck supple, trachea midline. No masses, lumps or thyromegaly present.  Cardiovascular: Normal rate and rhythm. S1,S2  noted.  No murmur, rubs or gallops noted. No JVD or BLE edema.  Pulmonary/Chest: Normal effort and positive vesicular breath sounds. No respiratory distress. No wheezes, rales or ronchi noted.  Abdomen: Normal bowel sounds. Pelvic: Normal female anatomy.  Cervix friable without mass or lesion.  No CMT.  Adnexa nonpalpable. Neurological: Alert and oriented.    BMET    Component Value Date/Time   NA 137 11/20/2022 1026   K 4.3 11/20/2022 1026   CL 105 11/20/2022 1026   CO2 23 11/20/2022 1026   GLUCOSE 87 11/20/2022 1026   BUN 11 11/20/2022 1026   CREATININE 0.76 11/20/2022 1026   CALCIUM 10.1 11/20/2022 1026   GFRNONAA >60 02/19/2019 1451   GFRAA >60 02/19/2019 1451    Lipid Panel     Component Value Date/Time   CHOL 215 (H) 11/20/2022 1026   TRIG 171 (H) 11/20/2022 1026   HDL 54 11/20/2022 1026   CHOLHDL 4.0 11/20/2022 1026   LDLCALC 130 (H) 11/20/2022 1026    CBC    Component Value Date/Time   WBC 7.7 11/20/2022 1026   RBC 4.63 11/20/2022 1026   HGB 14.6 11/20/2022 1026   HCT 42.8 11/20/2022 1026   PLT 331 11/20/2022 1026   MCV 92.4 11/20/2022 1026   MCH 31.5 11/20/2022 1026   MCHC 34.1 11/20/2022 1026   RDW 12.4 11/20/2022 1026    Hgb A1C Lab Results  Component Value Date   HGBA1C 5.2 11/20/2022     Assessment and Plan:  Screening for cervical cancer with routine GYN exam:  Pap smear today, she declines STD screening  RTC in 6 months for your annual exam Nicki Reaper, NP

## 2023-06-12 NOTE — Patient Instructions (Signed)
Pap Test Why am I having this test? A Pap test, also called a Pap smear, is a screening test to check for signs of: Infection. Cancer of the cervix. The cervix is the lower part of the uterus that opens into the vagina. Changes that may be a sign that cancer is developing (precancerous changes). Women need this test on a regular basis. In general, you should have a Pap test every 3 years until you reach menopause or age 34. Women aged 30-60 may choose to have their Pap test done at the same time as an HPV (human papillomavirus) test every 5 years (instead of every 3 years). Your health care provider may recommend having Pap tests more or less often depending on your medical conditions and past Pap test results. What is being tested? Cervical cells are tested for signs of infection or abnormalities. What kind of sample is taken?  Your health care provider will collect a sample of cells from the surface of your cervix. This will be done using a small cotton swab, plastic spatula, or brush that is inserted into your vagina using a tool called a speculum. This sample is often collected during a pelvic exam, when you are lying on your back on an exam table with your feet in footrests (stirrups). In some cases, fluids (secretions) from the cervix or vagina may also be collected. How do I prepare for this test? Be aware of where you are in your menstrual cycle. If you are menstruating on the day of the test, you may be asked to reschedule. You may need to reschedule if you have a known vaginal infection on the day of the test. Follow instructions from your health care provider about: Changing or stopping your regular medicines. Some medicines can cause abnormal test results, such as vaginal medicines and tetracycline. Avoiding douching 2-3 days before or the day of the test. Tell a health care provider about: Any allergies you have. All medicines you are taking, including vitamins, herbs, eye drops,  creams, and over-the-counter medicines. Any bleeding problems you have. Any surgeries you have had. Any medical conditions you have. Whether you are pregnant or may be pregnant. How are the results reported? Your test results will be reported as either abnormal or normal. What do the results mean? A normal test result means that you do not have signs of cancer of the cervix. An abnormal result may mean that you have: Cancer. A Pap test by itself is not enough to diagnose cancer. You will have more tests done if cancer is suspected. Precancerous changes in your cervix. Inflammation of the cervix. An STI (sexually transmitted infection). A fungal infection. A parasite infection. Talk with your health care provider about what your results mean. In some cases, your health care provider may do more testing to confirm the results. Questions to ask your health care provider Ask your health care provider, or the department that is doing the test: When will my results be ready? How will I get my results? What are my treatment options? What other tests do I need? What are my next steps? Summary In general, women should have a Pap test every 3 years until they reach menopause or age 34. Your health care provider will collect a sample of cells from the surface of your cervix. This will be done using a small cotton swab, plastic spatula, or brush. In some cases, fluids (secretions) from the cervix or vagina may also be collected. This information is not   intended to replace advice given to you by your health care provider. Make sure you discuss any questions you have with your health care provider. Document Revised: 06/16/2020 Document Reviewed: 06/16/2020 Elsevier Patient Education  2024 Elsevier Inc.  

## 2023-06-17 LAB — CYTOLOGY - PAP
Comment: NEGATIVE
Diagnosis: UNDETERMINED — AB
High risk HPV: NEGATIVE

## 2023-06-18 ENCOUNTER — Encounter: Payer: Self-pay | Admitting: Internal Medicine

## 2023-10-13 ENCOUNTER — Ambulatory Visit (INDEPENDENT_AMBULATORY_CARE_PROVIDER_SITE_OTHER): Payer: Self-pay | Admitting: Internal Medicine

## 2023-10-13 ENCOUNTER — Encounter: Payer: Self-pay | Admitting: Internal Medicine

## 2023-10-13 VITALS — BP 132/84 | Ht 64.5 in | Wt 207.4 lb

## 2023-10-13 DIAGNOSIS — R198 Other specified symptoms and signs involving the digestive system and abdomen: Secondary | ICD-10-CM

## 2023-10-13 DIAGNOSIS — R21 Rash and other nonspecific skin eruption: Secondary | ICD-10-CM

## 2023-10-13 MED ORDER — OLMESARTAN MEDOXOMIL 20 MG PO TABS: 20.0000 mg | ORAL_TABLET | Freq: Every day | ORAL | 1 refills | Status: AC

## 2023-10-13 NOTE — Progress Notes (Signed)
 HPI  Discussed the use of AI scribe software for clinical note transcription with the patient, who gave verbal consent to proceed.  Leslie Zavala is a 33 year old female who presents with a recurring rash and concerns about celiac disease.  She has been experiencing a recurring rash on her buttocks for approximately two years. The rash is pruritic and initially presents as raised papules, similar to mosquito bites. It is localized to one side and has recently worsened, causing pain in the skin of the right leg during flare-ups. The rash scars but she does not scratch it. She has not seen a dermatologist but has visited urgent care, where she was prescribed a clear ointment that she uses intermittently without significant relief.  She is concerned about the possibility of celiac disease due to the rash and has not undergone previous testing due to cost concerns. She experiences alternating episodes of diarrhea and constipation but has not been diagnosed with irritable bowel syndrome. Her diet remains consistent, and she does not follow a gluten-free diet.      Past Medical History:  Diagnosis Date   Allergy    Anxiety    Blood transfusion without reported diagnosis    Hypertension     Current Outpatient Medications  Medication Sig Dispense Refill   olmesartan  (BENICAR ) 20 MG tablet Take 1 tablet (20 mg total) by mouth daily. 90 tablet 0   No current facility-administered medications for this visit.    Allergies  Allergen Reactions   Codeine Nausea Only and Nausea And Vomiting    Family History  Problem Relation Age of Onset   Diabetes Mother    Depression Mother    Obesity Mother    Kidney cancer Father    Heart failure Father    Depression Father    Drug abuse Father    Sleep apnea Father    Diabetes Father    Anxiety disorder Father    Arthritis Father    Early death Father    Obesity Father    Hypertension Sister    Obesity Sister    Hypertension Sister     Obesity Sister    Mental illness Brother    Anorexia nervosa Brother    Anxiety disorder Brother    Depression Brother    Alcohol abuse Brother    Breast cancer Paternal Aunt    Alcohol abuse Paternal Aunt     Social History   Socioeconomic History   Marital status: Married    Spouse name: Not on file   Number of children: Not on file   Years of education: Not on file   Highest education level: 12th grade  Occupational History   Not on file  Tobacco Use   Smoking status: Former    Current packs/day: 1.50    Average packs/day: 1.5 packs/day for 5.0 years (7.5 ttl pk-yrs)    Types: Cigarettes   Smokeless tobacco: Never  Vaping Use   Vaping status: Never Used  Substance and Sexual Activity   Alcohol use: Not Currently   Drug use: Not Currently   Sexual activity: Yes    Birth control/protection: None    Comment: Husband had a vasectomy  Other Topics Concern   Not on file  Social History Narrative   Not on file   Social Drivers of Health   Financial Resource Strain: Low Risk  (06/11/2023)   Overall Financial Resource Strain (CARDIA)    Difficulty of Paying Living Expenses: Not hard at all  Food  Insecurity: No Food Insecurity (06/11/2023)   Hunger Vital Sign    Worried About Running Out of Food in the Last Year: Never true    Ran Out of Food in the Last Year: Never true  Transportation Needs: No Transportation Needs (06/11/2023)   PRAPARE - Administrator, Civil Service (Medical): No    Lack of Transportation (Non-Medical): No  Physical Activity: Sufficiently Active (06/11/2023)   Exercise Vital Sign    Days of Exercise per Week: 5 days    Minutes of Exercise per Session: 60 min  Stress: No Stress Concern Present (06/11/2023)   [employer] of Occupational Health - Occupational Stress Questionnaire    Feeling of Stress : Only a little  Social Connections: Socially Integrated (06/11/2023)   Social Connection and Isolation Panel    Frequency of  Communication with Friends and Family: More than three times a week    Frequency of Social Gatherings with Friends and Family: Once a week    Attends Religious Services: More than 4 times per year    Active Member of [employer] or Organizations: Yes    Attends Engineer, structural: More than 4 times per year    Marital Status: Married  Catering manager Violence: Not on file    ROS:  Constitutional: Denies fever, malaise, fatigue, headache or abrupt weight changes.  HEENT: Denies eye pain, eye redness, ear pain, ringing in the ears, wax buildup, runny nose, nasal congestion, bloody nose, or sore throat. Respiratory: Denies difficulty breathing, shortness of breath, cough or sputum production.   Cardiovascular: Denies chest pain, chest tightness, palpitations or swelling in the hands or feet.  Gastrointestinal: Pt reports alternating constipation and diarrhea. Denies abdominal pain, bloating, or blood in the stool.  GU: Denies frequency, urgency, pain with urination, blood in urine, odor or discharge. Musculoskeletal: Denies decrease in range of motion, difficulty with gait, muscle pain or joint pain and swelling.  Skin: Pt reports recurrent rashes of buttocks. Denies redness, lesions or ulcercations.  Neurological: Denies dizziness, difficulty with memory, difficulty with speech or problems with balance and coordination.  Psych: Denies anxiety, depression, SI/HI.  No other specific complaints in a complete review of systems (except as listed in HPI above).  PE:  BP 132/84 (BP Location: Left Arm, Patient Position: Sitting, Cuff Size: Normal)   Ht 5' 4.5 (1.638 m)   Wt 207 lb 6.4 oz (94.1 kg)   LMP 09/24/2023 (Approximate)   BMI 35.05 kg/m   Wt Readings from Last 3 Encounters:  06/12/23 210 lb 9.6 oz (95.5 kg)  11/20/22 212 lb (96.2 kg)  02/19/19 180 lb (81.6 kg)    General: Appears her stated age, obese, in NAD. Rash: Cluster of papular lesions noted to the right lateral  buttock. Cardiovascular: Normal rate. Pulmonary/Chest: Normal effort and positive vesicular breath sounds. No respiratory distress.  Neurological: Alert and oriented.     BMET    Component Value Date/Time   NA 137 11/20/2022 1026   K 4.3 11/20/2022 1026   CL 105 11/20/2022 1026   CO2 23 11/20/2022 1026   GLUCOSE 87 11/20/2022 1026   BUN 11 11/20/2022 1026   CREATININE 0.76 11/20/2022 1026   CALCIUM 10.1 11/20/2022 1026   GFRNONAA >60 02/19/2019 1451   GFRAA >60 02/19/2019 1451    Lipid Panel     Component Value Date/Time   CHOL 215 (H) 11/20/2022 1026   TRIG 171 (H) 11/20/2022 1026   HDL 54 11/20/2022 1026  CHOLHDL 4.0 11/20/2022 1026   LDLCALC 130 (H) 11/20/2022 1026    CBC    Component Value Date/Time   WBC 7.7 11/20/2022 1026   RBC 4.63 11/20/2022 1026   HGB 14.6 11/20/2022 1026   HCT 42.8 11/20/2022 1026   PLT 331 11/20/2022 1026   MCV 92.4 11/20/2022 1026   MCH 31.5 11/20/2022 1026   MCHC 34.1 11/20/2022 1026   RDW 12.4 11/20/2022 1026    Hgb A1C Lab Results  Component Value Date   HGBA1C 5.2 11/20/2022     Assessment and Plan:  Assessment and Plan    Recurrent Rash, alternating constipation and diarrhea Recurrent, itchy rash on buttocks for two years, localized to one side, worsening with leg pain. Differential includes dermatitis herpetiformis and HSV. Localization and recurrence suggest HSV. Not previously tested for HSV. -GI issues seem more consistent with IBS versus celiac disease - Order celiac panel per her request. - Order HSV IgG blood test. - Consider dermatology referral if tests inconclusive or negative. - Advise taking pictures of rash during flare-ups.   RTC in 1 month for your annual exam  Angeline Laura, NP

## 2023-10-13 NOTE — Patient Instructions (Signed)
Herpes Simplex Test Why am I having this test? The herpes simplex test is used to check for an infection with the herpes simplex virus (HSV). There are two common types of HSV: Type 1 (HSV1) primarily causes cold sores on or around the mouth and sometimes on or around the eyes. Type 2 (HSV2) is typically sexually transmitted and causes sores in and around the genitals. Both Type 1 and Type 2 can affect either the mouth and eyes or genital areas. You may need to have an HSV test if: Your health care provider thinks that you may have an HSV infection. You have a weakened body defense system (immune system) and you have sores around your mouth or genitals that look like HSV eruptions. You have sex with multiple partners, or your partner has genital herpes. You are pregnant, have herpes, and are expecting to deliver a baby vaginally in the next 6-8 weeks. What is being tested? There are two types of herpes simplex tests: Blood test. This test checks the sample for: HSV antibodies. Antibodies are proteins that your body makes to help fight infection. This test checks whether antibodies against HSV are in your blood. HSV antigens. This checks for the presence of the HSV virus (antigen) in your blood. Culture test. This test checks for the virus in a sample of fluid from an open sore. Culture tests take several days to complete. What kind of sample is taken?     Samples will be collected according to the type of tests your health care provider orders. For the blood tests, a blood sample is usually collected by inserting a needle into a blood vessel. For a culture test, the sample is usually collected by swabbing the fluid that is coming from an open sore during an active infection (outbreak). How are the results reported? Your test results will be reported as either positive or negative. For this test, normal results are: Negative for HSV virus or antibodies in your blood. Negative for HSV virus  in cultured fluid. What do the results mean? A positive result may indicate that you have an active HSV infection. The presence of HSV1 or HSV2 antigens or antibodies in your blood may mean that you have an active HSV infection. A negative result means that you do not have HSV1 or HSV2 virus in your blood. This may mean that you do not have an HSV infection. Talk with your health care provider about what your results mean. In some cases, your health care provider may do more testing to confirm the results. Questions to ask your health care provider Ask your health care provider, or the department that is doing the test: When will my results be ready? How will I get my results? What are my treatment options? What other tests do I need? What are my next steps? Summary You may have this test if your health care provider suspects that you have a herpes simplex virus (HSV) infection. Type 1 (HSV1) primarily causes cold sores on or around the mouth and sometimes on or around the eyes. Type 2 (HSV2) is typically sexually transmitted and causes sores in and around the genitals. The test may be done using a blood sample or a sample of fluid from an open sore. A positive result may mean that you have an active HSV infection. A negative result means that you probably do not have an active infection. Talk with your health care provider about what your results mean. This information is not intended  to replace advice given to you by your health care provider. Make sure you discuss any questions you have with your health care provider. Document Revised: 01/29/2021 Document Reviewed: 01/29/2021 Elsevier Patient Education  2024 [employer].

## 2023-10-14 ENCOUNTER — Ambulatory Visit: Payer: Self-pay | Admitting: Internal Medicine

## 2023-10-14 LAB — HSV(HERPES SIMPLEX VRS) I + II AB-IGG
HSV 1 IGG,TYPE SPECIFIC AB: 22.5 {index} — ABNORMAL HIGH
HSV 2 IGG,TYPE SPECIFIC AB: 5.46 {index} — ABNORMAL HIGH

## 2023-10-14 LAB — CELIAC DISEASE PANEL
(tTG) Ab, IgA: <1 U/mL
(tTG) Ab, IgG: <1 U/mL
Gliadin IgA: <1 U/mL
Gliadin IgG: <1 U/mL
Immunoglobulin A: 188 mg/dL (ref 47–310)

## 2023-10-15 MED ORDER — VALACYCLOVIR HCL 1 G PO TABS: 1000.0000 mg | ORAL_TABLET | Freq: Two times a day (BID) | ORAL | 0 refills | Status: AC

## 2023-11-21 ENCOUNTER — Encounter: Payer: Self-pay | Admitting: Internal Medicine

## 2023-12-16 ENCOUNTER — Encounter: Payer: Self-pay | Admitting: Internal Medicine

## 2023-12-16 MED ORDER — VALACYCLOVIR HCL 1 G PO TABS: 1000.0000 mg | ORAL_TABLET | Freq: Two times a day (BID) | ORAL | 0 refills | Status: AC

## 2024-01-22 ENCOUNTER — Encounter: Payer: Self-pay | Admitting: Internal Medicine

## 2024-01-22 NOTE — Telephone Encounter (Signed)
 She needs to schedule an appointment to discuss, virtual is fine.  We need to discuss suppressive therapy at this time.

## 2024-02-16 ENCOUNTER — Encounter: Payer: Self-pay | Admitting: Internal Medicine
